# Patient Record
Sex: Male | Born: 1960 | Race: White | Hispanic: No | Marital: Married | Smoking: Former smoker
Health system: Southern US, Community
[De-identification: ages and names within clinical notes are randomized; demographics above are authoritative.]

## PROBLEM LIST (undated history)

## (undated) DIAGNOSIS — I10 Essential (primary) hypertension: Secondary | ICD-10-CM

## (undated) DIAGNOSIS — N289 Disorder of kidney and ureter, unspecified: Secondary | ICD-10-CM

## (undated) HISTORY — PX: KNEE SURGERY: SHX244

---

## 2011-06-26 ENCOUNTER — Emergency Department (HOSPITAL_COMMUNITY): Payer: BC Managed Care – PPO

## 2011-06-26 ENCOUNTER — Emergency Department (HOSPITAL_COMMUNITY)
Admission: EM | Admit: 2011-06-26 | Discharge: 2011-06-26 | Disposition: A | Discharge status: Home / Self Care | Payer: BC Managed Care – PPO | Attending: Emergency Medicine | Admitting: Emergency Medicine

## 2011-06-26 ENCOUNTER — Encounter: Payer: Self-pay | Admitting: Emergency Medicine

## 2011-06-26 DIAGNOSIS — R110 Nausea: Secondary | ICD-10-CM | POA: Insufficient documentation

## 2011-06-26 DIAGNOSIS — R109 Unspecified abdominal pain: Secondary | ICD-10-CM | POA: Insufficient documentation

## 2011-06-26 DIAGNOSIS — R61 Generalized hyperhidrosis: Secondary | ICD-10-CM | POA: Insufficient documentation

## 2011-06-26 DIAGNOSIS — K5732 Diverticulitis of large intestine without perforation or abscess without bleeding: Secondary | ICD-10-CM

## 2011-06-26 DIAGNOSIS — D72829 Elevated white blood cell count, unspecified: Secondary | ICD-10-CM

## 2011-06-26 DIAGNOSIS — I10 Essential (primary) hypertension: Secondary | ICD-10-CM | POA: Insufficient documentation

## 2011-06-26 DIAGNOSIS — E119 Type 2 diabetes mellitus without complications: Secondary | ICD-10-CM | POA: Insufficient documentation

## 2011-06-26 DIAGNOSIS — I959 Hypotension, unspecified: Secondary | ICD-10-CM | POA: Insufficient documentation

## 2011-06-26 DIAGNOSIS — R42 Dizziness and giddiness: Secondary | ICD-10-CM | POA: Insufficient documentation

## 2011-06-26 HISTORY — DX: Essential (primary) hypertension: I10

## 2011-06-26 LAB — BASIC METABOLIC PANEL
BUN: 18 mg/dL (ref 6–23)
Chloride: 98 mEq/L (ref 96–112)
GFR calc Af Amer: >60 mL/min (ref >60)
Potassium: 3.8 mEq/L (ref 3.5–5.1)

## 2011-06-26 LAB — DIFFERENTIAL
Lymphocytes Relative: 9 % — ABNORMAL LOW (ref 12–46)
Monocytes Absolute: 0.8 10*3/uL (ref 0.1–1.0)
Monocytes Relative: 6 % (ref 3–12)
Neutro Abs: 11.3 10*3/uL — ABNORMAL HIGH (ref 1.7–7.7)

## 2011-06-26 LAB — URINALYSIS, ROUTINE W REFLEX MICROSCOPIC
Bilirubin Urine: NEGATIVE
Glucose, UA: NEGATIVE mg/dL
Specific Gravity, Urine: 1.015 (ref 1.005–1.030)
pH: 7.5 (ref 5.0–8.0)

## 2011-06-26 LAB — CBC
HCT: 42 % (ref 39.0–52.0)
Hemoglobin: 14.1 g/dL (ref 13.0–17.0)
MCHC: 33.6 g/dL (ref 30.0–36.0)

## 2011-06-26 LAB — URINE MICROSCOPIC-ADD ON

## 2011-06-26 MED ORDER — METRONIDAZOLE 500 MG PO TABS: 500.0000 mg | ORAL_TABLET | Freq: Four times a day (QID) | ORAL | Status: AC

## 2011-06-26 MED ORDER — IOHEXOL 300 MG/ML  SOLN
100.0000 mL | Freq: Once | INTRAMUSCULAR | Status: AC | PRN
  Administered 2011-06-26: 100 mL via INTRAVENOUS

## 2011-06-26 MED ORDER — OXYCODONE-ACETAMINOPHEN 5-325 MG PO TABS: 2.0000 | ORAL_TABLET | ORAL | Status: AC | PRN

## 2011-06-26 MED ORDER — CIPROFLOXACIN HCL 500 MG PO TABS: 500.0000 mg | ORAL_TABLET | Freq: Two times a day (BID) | ORAL | Status: AC

## 2011-06-26 MED ORDER — SODIUM CHLORIDE 0.9 % IV SOLN
INTRAVENOUS | Status: DC
  Administered 2011-06-26: 15:00:00 via INTRAVENOUS

## 2011-06-26 NOTE — ED Notes (Signed)
Pt c/o rt lower abd pain he describes at pressure. Pt states he has been sweating excessively and bp was 91/64

## 2011-06-26 NOTE — ED Provider Notes (Addendum)
History     CSN: [id number] Arrival date & time: 06/26/2011 11:54 AM   Chief Complaint  Patient presents with  . Abdominal Pain  . Excessive Sweating  . Hypotension     (Include location/radiation/quality/duration/timing/severity/associated sxs/prior treatment) HPI The patient is a 50 year old male with a history of hypertension.  He presents to the emergency department with the complaint of right lower abdominal pain since yesterday.  Nausea, diaphoresis, anorexia.  His wife said that he became very pale.  He denies fever, cough, diarrhea, or hematuria.  He has not had these symptoms in the past.  Past Medical History  Diagnosis Date  . Hypertension   . Diabetes mellitus      History reviewed. No pertinent past surgical history.  History reviewed. No pertinent family history.  History  Substance Use Topics  . Smoking status: Former Games developer  . Smokeless tobacco: Not on file  . Alcohol Use: No      Review of Systems  Constitutional: Positive for diaphoresis. Negative for fever.  HENT: Negative for neck pain.   Eyes: Negative for redness.  Respiratory: Negative for cough, chest tightness and shortness of breath.   Cardiovascular: Negative for chest pain and palpitations.  Gastrointestinal: Positive for nausea and abdominal pain. Negative for vomiting and diarrhea.  Genitourinary: Negative for hematuria.  Musculoskeletal: Negative for back pain.  Skin: Positive for pallor. Negative for rash.       His wife said he was pale when he was diaphoretic at home however, his color is normal.  Now  Neurological: Positive for light-headedness. Negative for headaches.       He was lightheaded at home, however, he denies lightheadedness, now  Psychiatric/Behavioral: Negative for confusion.    Allergies  Review of patient's allergies indicates no known allergies.  Home Medications   Current Outpatient Rx  Name Route Sig Dispense Refill  . ASPIRIN 81 MG PO TABS Oral Take 81  mg by mouth daily.      . ATORVASTATIN CALCIUM 40 MG PO TABS Oral Take 40 mg by mouth daily.      . IBUPROFEN 200 MG PO TABS Oral Take 600 mg by mouth every 6 (six) hours as needed. Pain     . METFORMIN HCL 500 MG PO TABS Oral Take 500 mg by mouth daily.      Marland Kitchen ONE-DAILY MULTI VITAMINS PO TABS Oral Take 1 tablet by mouth daily.      Marland Kitchen OLMESARTAN MEDOXOMIL 20 MG PO TABS Oral Take 20 mg by mouth daily.      Marland Kitchen FISH OIL 1200 MG PO CAPS Oral Take 1 capsule by mouth daily.        Physical Exam    BP 110/82  Pulse 93  Temp(Src) 98.6 F (37 C) (Oral)  Resp 16  Ht 5\' 9"  (1.753 m)  Wt 250 lb (113.399 kg)  BMI 36.92 kg/m2  SpO2 97%  Physical Exam  Constitutional: He is oriented to person, place, and time. He appears well-developed and well-nourished. No distress.       Obese  HENT:  Head: Normocephalic and atraumatic.  Eyes: EOM are normal. Pupils are equal, round, and reactive to light.  Neck: Normal range of motion. Neck supple.  Cardiovascular: Normal rate, regular rhythm and normal heart sounds.   No murmur heard. Pulmonary/Chest: Effort normal and breath sounds normal. No respiratory distress. He has no wheezes. He has no rales.  Abdominal: Soft. Bowel sounds are normal. He exhibits no distension and no mass.  There is tenderness. There is rebound. There is no guarding.       Right lower quadrant tenderness is mild  Musculoskeletal: Normal range of motion. He exhibits no edema and no tenderness.  Neurological: He is alert and oriented to person, place, and time. No cranial nerve deficit.  Skin: Skin is warm and dry. He is not diaphoretic.  Psychiatric: He has a normal mood and affect. His behavior is normal.    ED Course  Procedures  Right lower abdominal pain with tenderness to palpation, nausea, anorexia, and a history of diaphoresis and lightheadedness.  These symptoms and findings in the setting of a leukocytosis in a male suggests a appendicitis.  We will perform a CAT scan to  evaluate for this or other etiology for his symptoms.  At this point.  He does not want any pain medicines.  5:58 PM Still has only mild pain with no peritoneal signs.  I explained the results of his tests and the treatment course.  He and his wife understand and agree with the plan.  Results for orders placed during the hospital encounter of 06/26/11  BASIC METABOLIC PANEL      Component Value Range   Sodium 133 (*) 135 - 145 (mEq/L)   Potassium 3.8  3.5 - 5.1 (mEq/L)   Chloride 98  96 - 112 (mEq/L)   CO2 24  19 - 32 (mEq/L)   Glucose, Bld 173 (*) 70 - 99 (mg/dL)   BUN 18  6 - 23 (mg/dL)   Creatinine, Ser 1.61  0.50 - 1.35 (mg/dL)   Calcium 9.1  8.4 - 09.6 (mg/dL)   GFR calc non Af Amer >60  >60 (mL/min)   GFR calc Af Amer >60  >60 (mL/min)  CBC      Component Value Range   WBC 13.4 (*) 4.0 - 10.5 (K/uL)   RBC 4.92  4.22 - 5.81 (MIL/uL)   Hemoglobin 14.1  13.0 - 17.0 (g/dL)   HCT 04.5  40.9 - 81.1 (%)   MCV 85.4  78.0 - 100.0 (fL)   MCH 28.7  26.0 - 34.0 (pg)   MCHC 33.6  30.0 - 36.0 (g/dL)   RDW 91.4  78.2 - 95.6 (%)   Platelets 218  150 - 400 (K/uL)  DIFFERENTIAL      Component Value Range   Neutrophils Relative 85 (*) 43 - 77 (%)   Neutro Abs 11.3 (*) 1.7 - 7.7 (K/uL)   Lymphocytes Relative 9 (*) 12 - 46 (%)   Lymphs Abs 1.2  0.7 - 4.0 (K/uL)   Monocytes Relative 6  3 - 12 (%)   Monocytes Absolute 0.8  0.1 - 1.0 (K/uL)   Eosinophils Relative 0  0 - 5 (%)   Eosinophils Absolute 0.0  0.0 - 0.7 (K/uL)   Basophils Relative 0  0 - 1 (%)   Basophils Absolute 0.0  0.0 - 0.1 (K/uL)  URINALYSIS, ROUTINE W REFLEX MICROSCOPIC      Component Value Range   Color, Urine YELLOW  YELLOW    Appearance CLEAR  CLEAR    Specific Gravity, Urine 1.015  1.005 - 1.030    pH 7.5  5.0 - 8.0    Glucose, UA NEGATIVE  NEGATIVE (mg/dL)   Hgb urine dipstick TRACE (*) NEGATIVE    Bilirubin Urine NEGATIVE  NEGATIVE    Ketones, ur NEGATIVE  NEGATIVE (mg/dL)   Protein, ur NEGATIVE  NEGATIVE  (mg/dL)   Urobilinogen, UA 0.2  0.0 -  1.0 (mg/dL)   Nitrite NEGATIVE  NEGATIVE    Leukocytes, UA NEGATIVE  NEGATIVE   URINE MICROSCOPIC-ADD ON      Component Value Range   Squamous Epithelial / LPF RARE  RARE    WBC, UA 0-2  <3 (WBC/hpf)   RBC / HPF 0-2  <3 (RBC/hpf)   Bacteria, UA RARE  RARE     Date: 06/26/2011  Rate: 84  Rhythm: normal sinus rhythm  QRS Axis: normal  Intervals: normal  ST/T Wave abnormalities: nonspecific T wave changes  Conduction Disutrbances:none  Narrative Interpretation: NSR with nonspecific t wave changes  Old EKG Reviewed: not reviewed      MDM Abdominal pain Diverticulitis of the sigmoid colon.  No abscesses or perforations.  No appendicitis.       Nicholes Stairs, MD 06/26/11 1759  Nicholes Stairs, MD 08/23/11 1438

## 2011-06-26 NOTE — ED Notes (Signed)
Patient with no complaints at this time. Respirations even and unlabored. Skin warm/dry. Discharge instructions reviewed with patient at this time. Patient given opportunity to voice concerns/ask questions. IV removed per policy and band-aid applied to site. Patient discharged at this time and left Emergency Department with steady gait.  

## 2011-06-26 NOTE — ED Notes (Signed)
Family to desk wanting to know results of scan. Dr Nino Parsley notified.

## 2013-08-31 ENCOUNTER — Other Ambulatory Visit: Payer: Self-pay | Admitting: Endocrinology

## 2013-08-31 ENCOUNTER — Other Ambulatory Visit: Payer: Self-pay | Admitting: *Deleted

## 2013-08-31 DIAGNOSIS — E119 Type 2 diabetes mellitus without complications: Secondary | ICD-10-CM

## 2013-08-31 DIAGNOSIS — E230 Hypopituitarism: Secondary | ICD-10-CM

## 2013-08-31 DIAGNOSIS — R7303 Prediabetes: Secondary | ICD-10-CM | POA: Insufficient documentation

## 2015-12-28 ENCOUNTER — Encounter (HOSPITAL_COMMUNITY): Payer: Self-pay | Admitting: *Deleted

## 2015-12-28 ENCOUNTER — Emergency Department (HOSPITAL_COMMUNITY): Payer: BLUE CROSS/BLUE SHIELD

## 2015-12-28 ENCOUNTER — Emergency Department (HOSPITAL_COMMUNITY)
Admission: EM | Admit: 2015-12-28 | Discharge: 2015-12-28 | Disposition: A | Discharge status: Home / Self Care | Payer: BLUE CROSS/BLUE SHIELD | Attending: Emergency Medicine | Admitting: Emergency Medicine

## 2015-12-28 DIAGNOSIS — I10 Essential (primary) hypertension: Secondary | ICD-10-CM | POA: Insufficient documentation

## 2015-12-28 DIAGNOSIS — Z7984 Long term (current) use of oral hypoglycemic drugs: Secondary | ICD-10-CM | POA: Insufficient documentation

## 2015-12-28 DIAGNOSIS — N201 Calculus of ureter: Secondary | ICD-10-CM | POA: Diagnosis not present

## 2015-12-28 DIAGNOSIS — Z7982 Long term (current) use of aspirin: Secondary | ICD-10-CM | POA: Insufficient documentation

## 2015-12-28 DIAGNOSIS — E119 Type 2 diabetes mellitus without complications: Secondary | ICD-10-CM | POA: Diagnosis not present

## 2015-12-28 DIAGNOSIS — Z87891 Personal history of nicotine dependence: Secondary | ICD-10-CM | POA: Diagnosis not present

## 2015-12-28 DIAGNOSIS — R109 Unspecified abdominal pain: Secondary | ICD-10-CM | POA: Diagnosis present

## 2015-12-28 LAB — URINE MICROSCOPIC-ADD ON
SQUAMOUS EPITHELIAL / LPF: NONE SEEN
WBC, UA: NONE SEEN WBC/hpf (ref 0–5)

## 2015-12-28 LAB — CBC WITH DIFFERENTIAL/PLATELET
BASOS PCT: 0 %
Basophils Absolute: 0 10*3/uL (ref 0.0–0.1)
Eosinophils Absolute: 0 10*3/uL (ref 0.0–0.7)
Eosinophils Relative: 0 %
HCT: 42.8 % (ref 39.0–52.0)
HEMOGLOBIN: 14.5 g/dL (ref 13.0–17.0)
LYMPHS PCT: 13 %
Lymphs Abs: 1.4 10*3/uL (ref 0.7–4.0)
MCH: 29.2 pg (ref 26.0–34.0)
MCHC: 33.9 g/dL (ref 30.0–36.0)
MCV: 86.1 fL (ref 78.0–100.0)
Monocytes Absolute: 0.4 10*3/uL (ref 0.1–1.0)
Monocytes Relative: 4 %
NEUTROS ABS: 8.5 10*3/uL — AB (ref 1.7–7.7)
NEUTROS PCT: 83 %
PLATELETS: 204 10*3/uL (ref 150–400)
RBC: 4.97 MIL/uL (ref 4.22–5.81)
RDW: 13 % (ref 11.5–15.5)
WBC: 10.4 10*3/uL (ref 4.0–10.5)

## 2015-12-28 LAB — COMPREHENSIVE METABOLIC PANEL
ALT: 43 U/L (ref 17–63)
ANION GAP: 7 (ref 5–15)
AST: 28 U/L (ref 15–41)
Albumin: 4.1 g/dL (ref 3.5–5.0)
Alkaline Phosphatase: 71 U/L (ref 38–126)
BUN: 25 mg/dL — ABNORMAL HIGH (ref 6–20)
CHLORIDE: 105 mmol/L (ref 101–111)
CO2: 26 mmol/L (ref 22–32)
Calcium: 8.8 mg/dL — ABNORMAL LOW (ref 8.9–10.3)
Creatinine, Ser: 1.15 mg/dL (ref 0.61–1.24)
Glucose, Bld: 234 mg/dL — ABNORMAL HIGH (ref 65–99)
POTASSIUM: 4.2 mmol/L (ref 3.5–5.1)
Sodium: 138 mmol/L (ref 135–145)
Total Bilirubin: 0.8 mg/dL (ref 0.3–1.2)
Total Protein: 7.4 g/dL (ref 6.5–8.1)

## 2015-12-28 LAB — URINALYSIS, ROUTINE W REFLEX MICROSCOPIC
Glucose, UA: 250 mg/dL — AB
KETONES UR: NEGATIVE mg/dL
Leukocytes, UA: NEGATIVE
NITRITE: NEGATIVE
PROTEIN: NEGATIVE mg/dL
SPECIFIC GRAVITY, URINE: 1.03 (ref 1.005–1.030)
pH: 5 (ref 5.0–8.0)

## 2015-12-28 LAB — LIPASE, BLOOD: LIPASE: 28 U/L (ref 11–51)

## 2015-12-28 MED ORDER — OXYCODONE-ACETAMINOPHEN 5-325 MG PO TABS: 1.0000 | ORAL_TABLET | ORAL | Status: AC | PRN

## 2015-12-28 MED ORDER — HYDROMORPHONE HCL 1 MG/ML IJ SOLN
1.0000 mg | Freq: Once | INTRAMUSCULAR | Status: AC
  Administered 2015-12-28: 1 mg via INTRAVENOUS
  Filled 2015-12-28: qty 1

## 2015-12-28 MED ORDER — FENTANYL CITRATE (PF) 100 MCG/2ML IJ SOLN
50.0000 ug | Freq: Once | INTRAMUSCULAR | Status: AC
  Administered 2015-12-28: 50 ug via INTRAVENOUS
  Filled 2015-12-28: qty 2

## 2015-12-28 MED ORDER — KETOROLAC TROMETHAMINE 30 MG/ML IJ SOLN
30.0000 mg | Freq: Once | INTRAMUSCULAR | Status: AC
  Administered 2015-12-28: 30 mg via INTRAVENOUS
  Filled 2015-12-28: qty 1

## 2015-12-28 MED ORDER — TAMSULOSIN HCL 0.4 MG PO CAPS: ORAL_CAPSULE | ORAL | Status: DC

## 2015-12-28 MED ORDER — SODIUM CHLORIDE 0.9 % IV SOLN
INTRAVENOUS | Status: DC
  Administered 2015-12-28: 06:00:00 via INTRAVENOUS

## 2015-12-28 MED ORDER — KETOROLAC TROMETHAMINE 10 MG PO TABS: 10.0000 mg | ORAL_TABLET | Freq: Four times a day (QID) | ORAL | Status: AC

## 2015-12-28 MED ORDER — ONDANSETRON HCL 4 MG/2ML IJ SOLN
4.0000 mg | Freq: Once | INTRAMUSCULAR | Status: AC
  Administered 2015-12-28: 4 mg via INTRAVENOUS
  Filled 2015-12-28: qty 2

## 2015-12-28 MED ORDER — ONDANSETRON 4 MG PO TBDP: ORAL_TABLET | ORAL | Status: AC

## 2015-12-28 MED ORDER — SODIUM CHLORIDE 0.9 % IV BOLUS (SEPSIS): 1000.0000 mL | Freq: Once | INTRAVENOUS | Status: DC

## 2015-12-28 NOTE — ED Notes (Signed)
MD at bedside. 

## 2015-12-28 NOTE — ED Notes (Signed)
Pt c/o left flank pain that woke him up from sleep; pt denies any urinary problems

## 2015-12-28 NOTE — ED Notes (Signed)
Pt states he has had this type of pain previously with an episode of diverticulitis

## 2015-12-28 NOTE — ED Provider Notes (Signed)
CSN: [id number]     Arrival date & time 12/28/15  0539 History   First MD Initiated Contact with Patient 12/28/15 0554     Chief Complaint  Patient presents with  . Flank Pain     (Consider location/radiation/quality/duration/timing/severity/associated sxs/prior Treatment) HPI patient reports he was awakened at 2 AM this morning with left flank pain that does not radiate into his abdomen. He describes the pain as being constant and a pressure sensation. He states sitting up makes it hurts more, walking seems to help it a little. He has had nausea with vomiting twice. He denies diarrhea or hematuria. He states he had similar pain when he had diverticulitis but he indicates that pain was in his right anterior abdomen. He also reports he was started on a new medication trulicity which she gets injected weekly for the past 5 or 6 weeks. He reports he has been having nausea since he started that medication. He states that the warnings do show increased risk of pancreatitis.   PCP Dr Jonelle Sports in [place], [place]  Past Medical History  Diagnosis Date  . Hypertension   . Diabetes mellitus    History reviewed. No pertinent past surgical history. History reviewed. No pertinent family history. Social History  Substance Use Topics  . Smoking status: Former Games developer  . Smokeless tobacco: None  . Alcohol Use: No  employed  Review of Systems  All other systems reviewed and are negative.     Allergies  Review of patient's allergies indicates no known allergies.  Home Medications   Prior to Admission medications   Medication Sig Start Date End Date Taking? Authorizing Provider  aspirin 81 MG tablet Take 81 mg by mouth daily.     Yes Historical Provider, MD  atorvastatin (LIPITOR) 40 MG tablet Take 40 mg by mouth daily.     Yes Historical Provider, MD  Dulaglutide (TRULICITY) 0.75 MG/0.5ML SOPN Inject 1 Dose into the skin once a week.   Yes Historical Provider, MD  ibuprofen (ADVIL,MOTRIN) 200  MG tablet Take 600 mg by mouth every 6 (six) hours as needed. Pain    Yes Historical Provider, MD  metFORMIN (GLUCOPHAGE) 500 MG tablet Take 500 mg by mouth daily.     Yes Historical Provider, MD  Multiple Vitamin (MULTIVITAMIN) tablet Take 1 tablet by mouth daily.     Yes Historical Provider, MD  olmesartan (BENICAR) 20 MG tablet Take 40 mg by mouth daily.    Yes Historical Provider, MD  Omega-3 Fatty Acids (FISH OIL) 1200 MG CAPS Take 1 capsule by mouth daily.     Yes Historical Provider, MD  ketorolac (TORADOL) 10 MG tablet Take 1 tablet (10 mg total) by mouth every 6 (six) hours. Take with food 12/28/15   Devoria Albe, MD  ondansetron (ZOFRAN ODT) 4 MG disintegrating tablet Take 1 or 2 po Q 8hrs for nausea or vomiting 12/28/15   Devoria Albe, MD  oxyCODONE-acetaminophen (PERCOCET/ROXICET) 5-325 MG tablet Take 1-2 tablets by mouth every 4 (four) hours as needed for severe pain. 12/28/15   Devoria Albe, MD  tamsulosin (FLOMAX) 0.4 MG CAPS capsule Take 1 po QD until you pass the stone. 12/28/15   Devoria Albe, MD   BP 153/98 mmHg  Pulse 81  Temp(Src) 97.8 F (36.6 C) (Oral)  Resp 16  SpO2 96%  Vital signs normal   Physical Exam  Constitutional: He is oriented to person, place, and time. He appears well-developed and well-nourished.  Non-toxic appearance. He does not appear  ill. No distress.  HENT:  Head: Normocephalic and atraumatic.  Right Ear: External ear normal.  Left Ear: External ear normal.  Nose: Nose normal. No mucosal edema or rhinorrhea.  Mouth/Throat: Oropharynx is clear and moist and mucous membranes are normal. No dental abscesses or uvula swelling.  Eyes: Conjunctivae and EOM are normal. Pupils are equal, round, and reactive to light.  Neck: Normal range of motion and full passive range of motion without pain. Neck supple.  Cardiovascular: Normal rate, regular rhythm and normal heart sounds.  Exam reveals no gallop and no friction rub.   No murmur heard. Pulmonary/Chest: Effort normal  and breath sounds normal. No respiratory distress. He has no wheezes. He has no rhonchi. He has no rales. He exhibits no tenderness and no crepitus.  Abdominal: Soft. Normal appearance and bowel sounds are normal. He exhibits no distension. There is no tenderness. There is no rebound and no guarding.  Musculoskeletal: Normal range of motion. He exhibits no edema or tenderness.       Back:  Area of pain noted. Moves all extremities well.   Neurological: He is alert and oriented to person, place, and time. He has normal strength. No cranial nerve deficit.  Skin: Skin is warm, dry and intact. No rash noted. No erythema. No pallor.  Psychiatric: He has a normal mood and affect. His speech is normal and behavior is normal. His mood appears not anxious.  Nursing note and vitals reviewed.   ED Course  Procedures (including critical care time)  Medications  0.9 %  sodium chloride infusion ( Intravenous New Bag/Given 12/28/15 0623)  ondansetron (ZOFRAN) injection 4 mg (4 mg Intravenous Given 12/28/15 0623)  fentaNYL (SUBLIMAZE) injection 50 mcg (50 mcg Intravenous Given 12/28/15 0623)  ketorolac (TORADOL) 30 MG/ML injection 30 mg (30 mg Intravenous Given 12/28/15 0728)  HYDROmorphone (DILAUDID) injection 1 mg (1 mg Intravenous Given 12/28/15 0729)   Patient was given IV fluids, fentanyl and Zofran for his pain and nausea. CT scan was ordered for possible renal stone  Patient was rechecked at 7 AM. He states his pain is slightly better. We are still waiting for his CT to be read by the radiologist. He was reassured though that his blood tests for pancreatitis were normal.  7:20 AM patient was given the results of his CT scan. He was given IV Toradol and Dilaudid for his pain. We discussed that he should pass the stone in the next day or possibly week. He will be sent home with pain medication. He will be referred to [facility]. He should return to the ED if he gets fever, uncontrolled vomiting, or  uncontrolled pain. When he is at work he should drink in addition to just plain water some sports drinks to prevent dehydration. Patient reports he has already cut out caffeine and milk products.  07:50 pt states his pain is gone, informed we need a UA so he can be discharged. Dr Deretha Emory will check his UA results and add antibiotics if needed.   Labs Review Results for orders placed or performed during the hospital encounter of 12/28/15  Comprehensive metabolic panel  Result Value Ref Range   Sodium 138 135 - 145 mmol/L   Potassium 4.2 3.5 - 5.1 mmol/L   Chloride 105 101 - 111 mmol/L   CO2 26 22 - 32 mmol/L   Glucose, Bld 234 (H) 65 - 99 mg/dL   BUN 25 (H) 6 - 20 mg/dL   Creatinine, Ser 1.61 0.61 -  1.24 mg/dL   Calcium 8.8 (L) 8.9 - 10.3 mg/dL   Total Protein 7.4 6.5 - 8.1 g/dL   Albumin 4.1 3.5 - 5.0 g/dL   AST 28 15 - 41 U/L   ALT 43 17 - 63 U/L   Alkaline Phosphatase 71 38 - 126 U/L   Total Bilirubin 0.8 0.3 - 1.2 mg/dL   GFR calc non Af Amer >60 >60 mL/min   GFR calc Af Amer >60 >60 mL/min   Anion gap 7 5 - 15  Lipase, blood  Result Value Ref Range   Lipase 28 11 - 51 U/L  CBC with Differential  Result Value Ref Range   WBC 10.4 4.0 - 10.5 K/uL   RBC 4.97 4.22 - 5.81 MIL/uL   Hemoglobin 14.5 13.0 - 17.0 g/dL   HCT 16.1 09.6 - 04.5 %   MCV 86.1 78.0 - 100.0 fL   MCH 29.2 26.0 - 34.0 pg   MCHC 33.9 30.0 - 36.0 g/dL   RDW 40.9 81.1 - 91.4 %   Platelets 204 150 - 400 K/uL   Neutrophils Relative % 83 %   Neutro Abs 8.5 (H) 1.7 - 7.7 K/uL   Lymphocytes Relative 13 %   Lymphs Abs 1.4 0.7 - 4.0 K/uL   Monocytes Relative 4 %   Monocytes Absolute 0.4 0.1 - 1.0 K/uL   Eosinophils Relative 0 %   Eosinophils Absolute 0.0 0.0 - 0.7 K/uL   Basophils Relative 0 %   Basophils Absolute 0.0 0.0 - 0.1 K/uL   Laboratory interpretation all normal except UA not resulted.      Imaging Review Ct Renal Stone Study  12/28/2015  CLINICAL DATA:  55 year old male with left-sided  abdominal pain and flank pain. EXAM: CT ABDOMEN AND PELVIS WITHOUT CONTRAST TECHNIQUE: Multidetector CT imaging of the abdomen and pelvis was performed following the standard protocol without IV contrast. COMPARISON:  CT dated 06/26/2011 FINDINGS: Evaluation of this exam is limited in the absence of intravenous contrast. The visualized lung bases are clear. No intra-abdominal free air or free fluid. Mild diffuse fatty infiltration of the liver. The gallbladder, pancreas, spleen, adrenal glands appear unremarkable. There is a 3 mm stone at the left ureterovesical junction with mild left hydronephrosis. Multiple small nonobstructing left renal calculi noted. Punctate nonobstructing right renal calculi may be present. There is no hydronephrosis on the right. The urinary bladder appears unremarkable. The prostate and seminal vesicles are grossly unremarkable. There is sigmoid diverticulosis without active inflammatory changes. Moderate stool noted throughout the colon. No evidence of bowel obstruction or inflammation. Normal appendix. There is mild aortoiliac atherosclerotic disease. No portal venous gas identified. There is no adenopathy. Small fat containing right inguinal hernia. The abdominal wall soft tissues appear unremarkable. There is mild degenerative changes of the spine. No acute fracture. IMPRESSION: A 3 mm left UVJ stone with mild left hydronephrosis. Sigmoid diverticulosis. No evidence of bowel obstruction or inflammation. Normal appendix. Electronically Signed   By: Elgie Collard M.D.   On: 12/28/2015 07:11   I have personally reviewed and evaluated these images and lab results as part of my medical decision-making.    MDM   Final diagnoses:  Left ureteral stone   New Prescriptions   KETOROLAC (TORADOL) 10 MG TABLET    Take 1 tablet (10 mg total) by mouth every 6 (six) hours. Take with food   ONDANSETRON (ZOFRAN ODT) 4 MG DISINTEGRATING TABLET    Take 1 or 2 po Q 8hrs for nausea  or  vomiting   OXYCODONE-ACETAMINOPHEN (PERCOCET/ROXICET) 5-325 MG TABLET    Take 1-2 tablets by mouth every 4 (four) hours as needed for severe pain.   TAMSULOSIN (FLOMAX) 0.4 MG CAPS CAPSULE    Take 1 po QD until you pass the stone.    Plan discharge  Devoria AlbeIva Kemo Spruce, MD, [facility]      Valita Righter, MD 12/28/15 [phone]

## 2015-12-28 NOTE — Discharge Instructions (Signed)
Drink plenty of fluids. Take the medications as prescribed. You have a "3 mm distal UVJ stone on the left".  Return to the ED if you have fever, uncontrolled vomiting pain. Otherwise you can follow-up with [facility] if you have not passed the stone in the next week. Drink plenty of fluids when sweating.    Kidney Stones Kidney stones (urolithiasis) are deposits that form inside your kidneys. The intense pain is caused by the stone moving through the urinary tract. When the stone moves, the ureter goes into spasm around the stone. The stone is usually passed in the urine.  CAUSES   A disorder that makes certain neck glands produce too much parathyroid hormone (primary hyperparathyroidism).  A buildup of uric acid crystals, similar to gout in your joints.  Narrowing (stricture) of the ureter.  A kidney obstruction present at birth (congenital obstruction).  Previous surgery on the kidney or ureters.  Numerous kidney infections. SYMPTOMS   Feeling sick to your stomach (nauseous).  Throwing up (vomiting).  Blood in the urine (hematuria).  Pain that usually spreads (radiates) to the groin.  Frequency or urgency of urination. DIAGNOSIS   Taking a history and physical exam.  Blood or urine tests.  CT scan.  Occasionally, an examination of the inside of the urinary bladder (cystoscopy) is performed. TREATMENT   Observation.  Increasing your fluid intake.  Extracorporeal shock wave lithotripsy--This is a noninvasive procedure that uses shock waves to break up kidney stones.  Surgery may be needed if you have severe pain or persistent obstruction. There are various surgical procedures. Most of the procedures are performed with the use of small instruments. Only small incisions are needed to accommodate these instruments, so recovery time is minimized. The size, location, and chemical composition are all important variables that will determine the proper choice of action  for you. Talk to your health care provider to better understand your situation so that you will minimize the risk of injury to yourself and your kidney.  HOME CARE INSTRUCTIONS   Drink enough water and fluids to keep your urine clear or pale yellow. This will help you to pass the stone or stone fragments.  Strain all urine through the provided strainer. Keep all particulate matter and stones for your health care provider to see. The stone causing the pain may be as small as a grain of salt. It is very important to use the strainer each and every time you pass your urine. The collection of your stone will allow your health care provider to analyze it and verify that a stone has actually passed. The stone analysis will often identify what you can do to reduce the incidence of recurrences.  Only take over-the-counter or prescription medicines for pain, discomfort, or fever as directed by your health care provider.  Keep all follow-up visits as told by your health care provider. This is important.  Get follow-up X-rays if required. The absence of pain does not always mean that the stone has passed. It may have only stopped moving. If the urine remains completely obstructed, it can cause loss of kidney function or even complete destruction of the kidney. It is your responsibility to make sure X-rays and follow-ups are completed. Ultrasounds of the kidney can show blockages and the status of the kidney. Ultrasounds are not associated with any radiation and can be performed easily in a matter of minutes.  Make changes to your daily diet as told by your health care provider. You may  be told to:  Limit the amount of salt that you eat.  Eat 5 or more servings of fruits and vegetables each day.  Limit the amount of meat, poultry, fish, and eggs that you eat.  Collect a 24-hour urine sample as told by your health care provider.You may need to collect another urine sample every 6-12 months. SEEK MEDICAL  CARE IF:  You experience pain that is progressive and unresponsive to any pain medicine you have been prescribed. SEEK IMMEDIATE MEDICAL CARE IF:   Pain cannot be controlled with the prescribed medicine.  You have a fever or shaking chills.  The severity or intensity of pain increases over 18 hours and is not relieved by pain medicine.  You develop a new onset of abdominal pain.  You feel faint or pass out.  You are unable to urinate.   This information is not intended to replace advice given to you by your health care provider. Make sure you discuss any questions you have with your health care provider.   Document Released: 09/24/2005 Document Revised: 06/15/2015 Document Reviewed: 02/25/2013 [employer] Interactive Patient Education [employer]2016 [employer]

## 2018-01-09 IMAGING — CT CT RENAL STONE PROTOCOL
2 of 4 series · 17 of 46 positions shown, 19 images · non-contrast
Comparison: CT dated 06/26/2011

CLINICAL DATA: 54-year-old male with left-sided abdominal pain and
flank pain.

EXAM:
CT ABDOMEN AND PELVIS WITHOUT CONTRAST
TECHNIQUE: Multidetector CT imaging of the abdomen and pelvis was performed
following the standard protocol without IV contrast.

[Series 2: standard/full over (age)lbs 5.0 · axial · 0.98mm/px · z∈[-528,-48]mm · 14 of 106 slices shown, 16 images]
[im 5/106  soft-tissue]
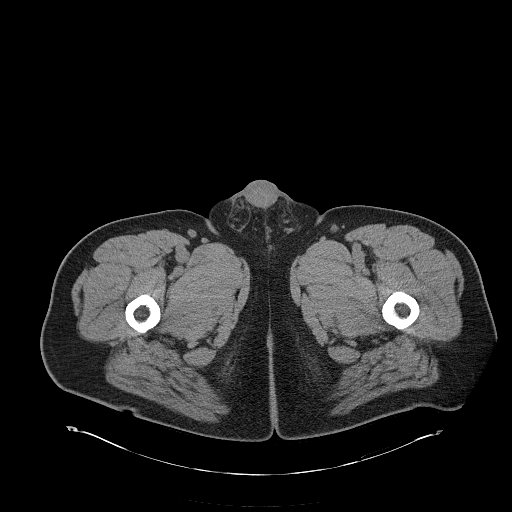
[im 5/106  bone]
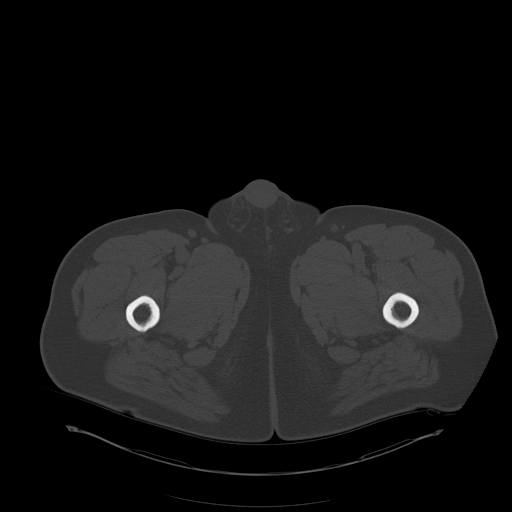
[im 14/106  soft-tissue]
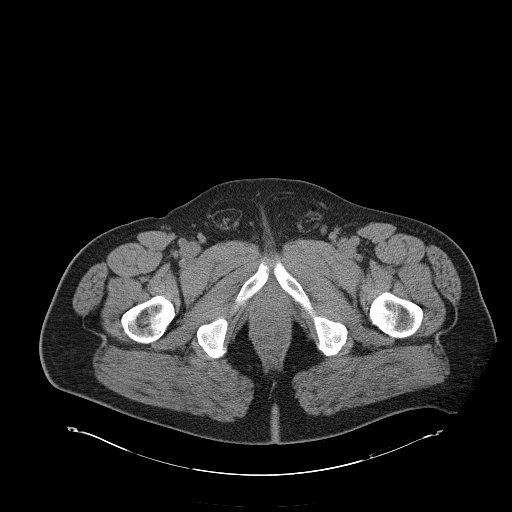
[im 22/106  soft-tissue]
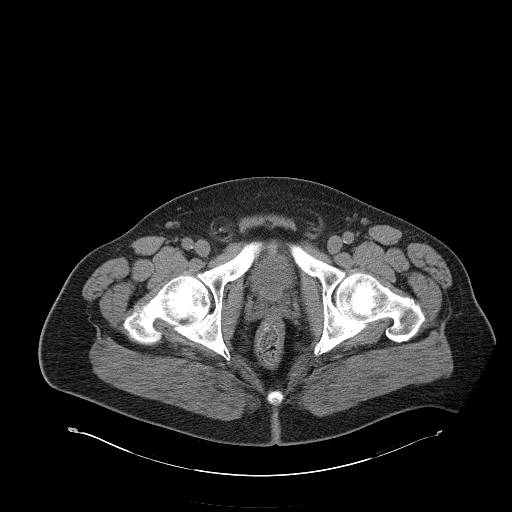
[im 27/106  soft-tissue]
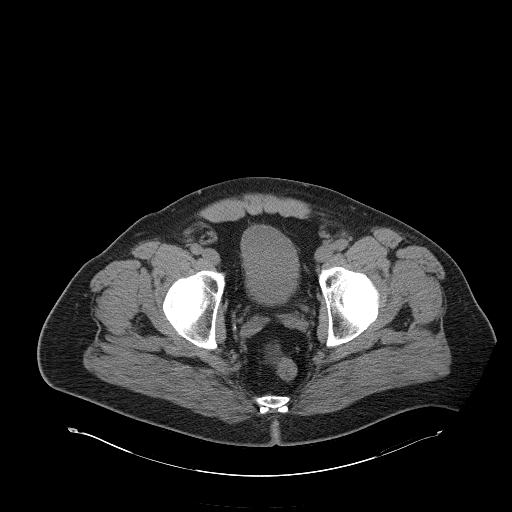
[im 36/106  soft-tissue]
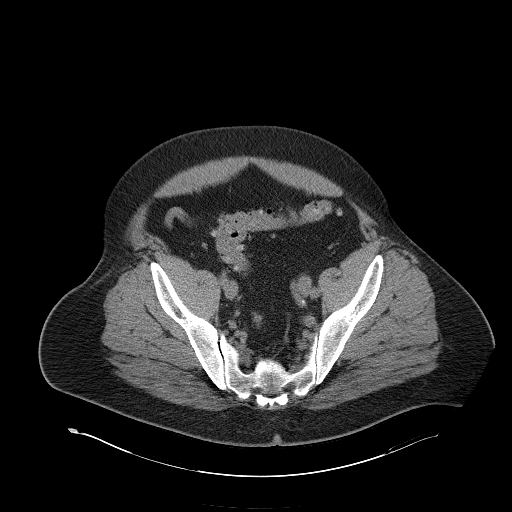
[im 44/106  soft-tissue]
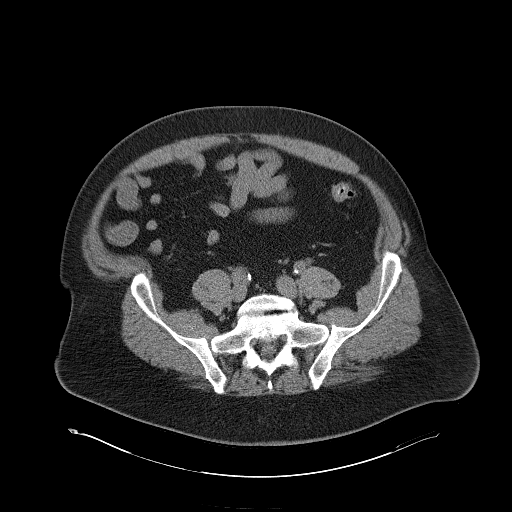
[im 49/106  soft-tissue]
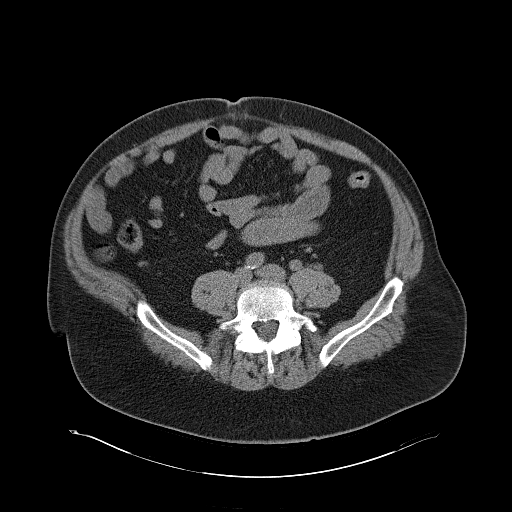
[im 57/106  soft-tissue]
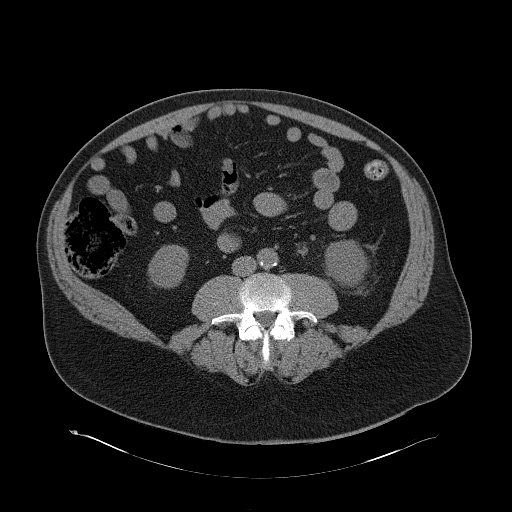
[im 62/106  soft-tissue]
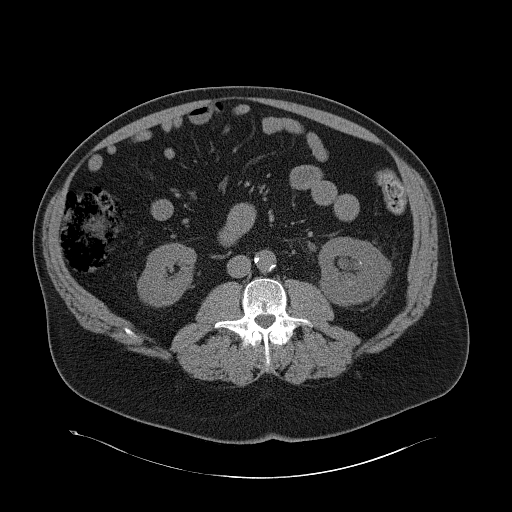
[im 62/106  bone]
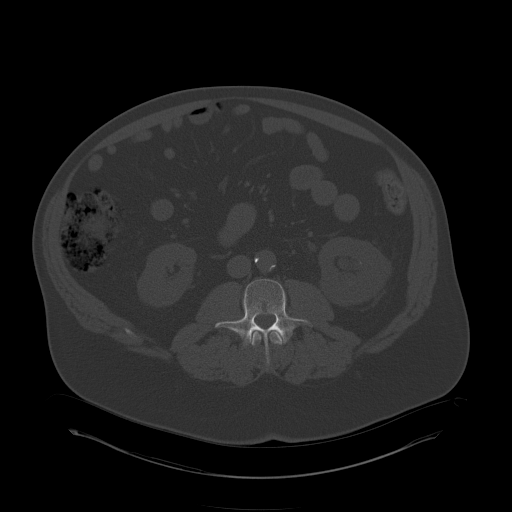
[im 71/106  soft-tissue]
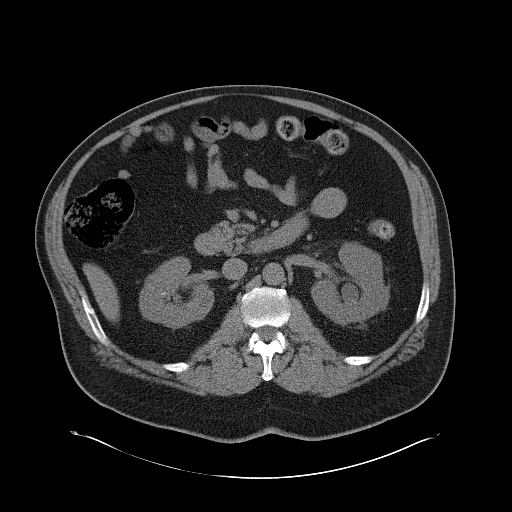
[im 79/106  soft-tissue]
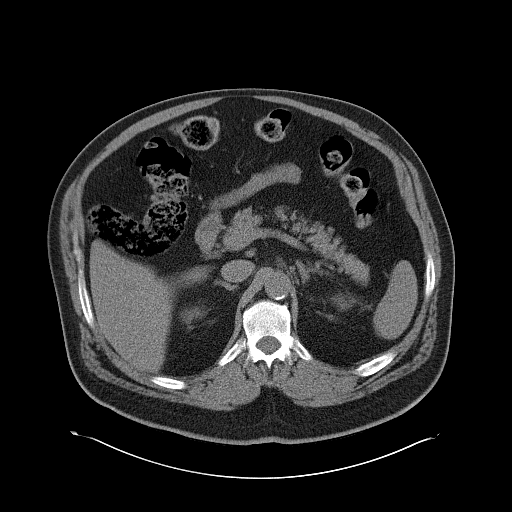
[im 84/106  soft-tissue]
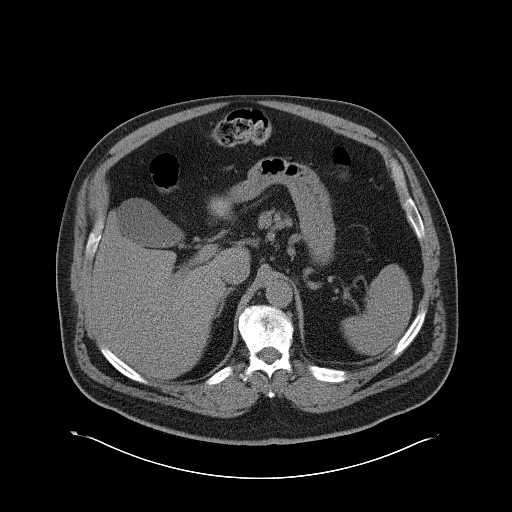
[im 92/106  soft-tissue]
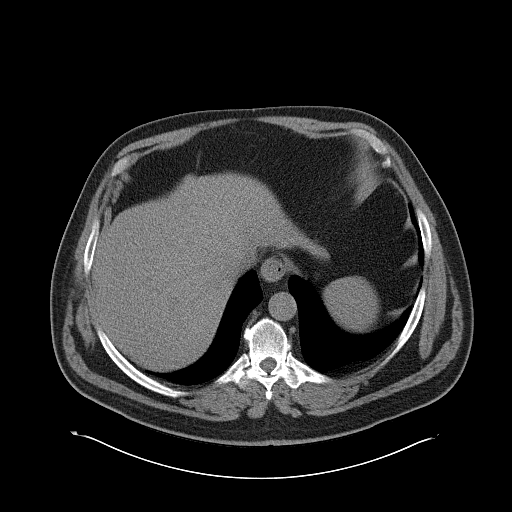
[im 101/106  soft-tissue]
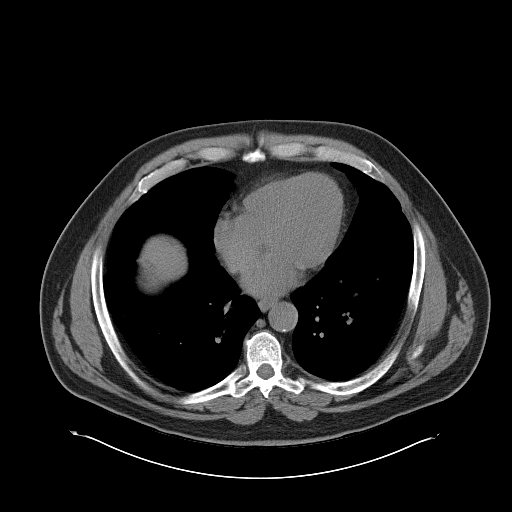

[Series 3: mpr coronal · coronal · 1.03mm/px · 3 of 114 slices shown]
[im 38/114  soft-tissue]
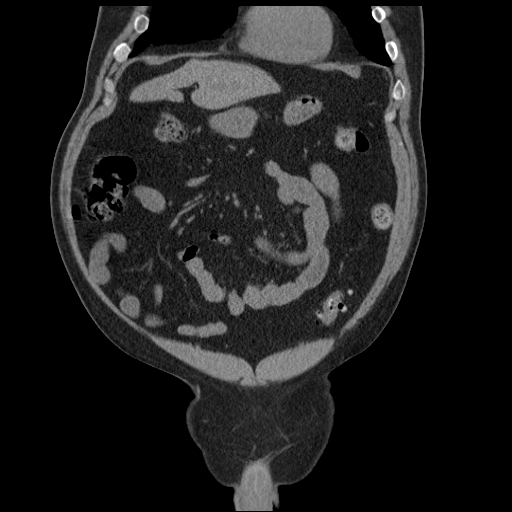
[im 51/114  soft-tissue]
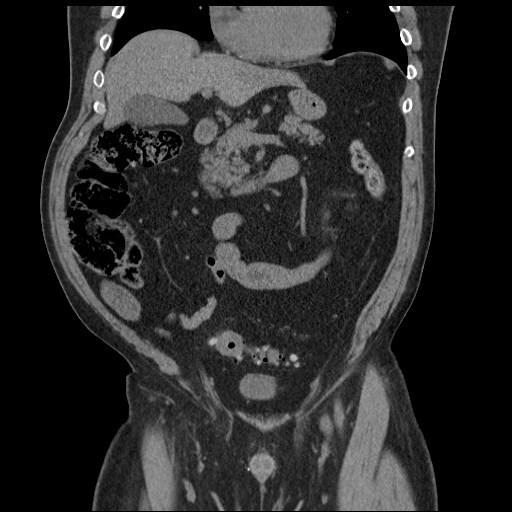
[im 63/114  soft-tissue]
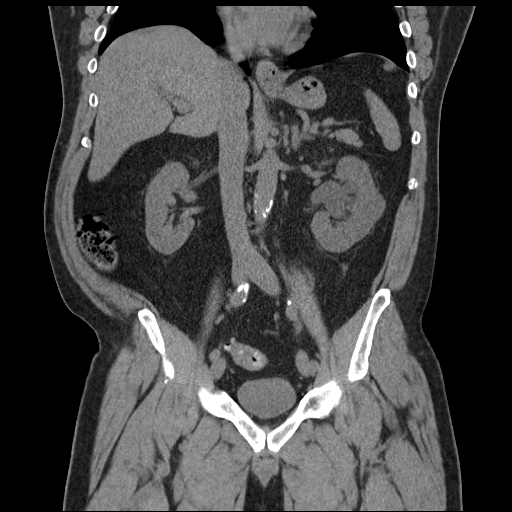

[17 of 46 positions shown; findings below may reference images not displayed]

FINDINGS: Evaluation of this exam is limited in the absence of intravenous
contrast.

The visualized lung bases are clear. No intra-abdominal free air or
free fluid.

Mild diffuse fatty infiltration of the liver. The gallbladder,
pancreas, spleen, adrenal glands appear unremarkable. There is a 3
mm stone at the left ureterovesical junction with mild left
hydronephrosis. Multiple small nonobstructing left renal calculi
noted. Punctate nonobstructing right renal calculi may be present.
There is no hydronephrosis on the right. The urinary bladder appears
unremarkable. The prostate and seminal vesicles are grossly
unremarkable. There is sigmoid diverticulosis without active
inflammatory changes. Moderate stool noted throughout the colon. No
evidence of bowel obstruction or inflammation. Normal appendix.

There is mild aortoiliac atherosclerotic disease. No portal venous
gas identified. There is no adenopathy. Small fat containing right
inguinal hernia. The abdominal wall soft tissues appear
unremarkable. There is mild degenerative changes of the spine. No
acute fracture.
IMPRESSION: A 3 mm left UVJ stone with mild left hydronephrosis.

Sigmoid diverticulosis. No evidence of bowel obstruction or
inflammation. Normal appendix.

## 2020-02-04 ENCOUNTER — Emergency Department (HOSPITAL_COMMUNITY): Payer: BC Managed Care – PPO

## 2020-02-04 ENCOUNTER — Other Ambulatory Visit: Payer: Self-pay

## 2020-02-04 ENCOUNTER — Encounter (HOSPITAL_COMMUNITY): Payer: Self-pay

## 2020-02-04 ENCOUNTER — Emergency Department (HOSPITAL_COMMUNITY)
Admission: EM | Admit: 2020-02-04 | Discharge: 2020-02-04 | Disposition: A | Discharge status: Home / Self Care | Payer: BC Managed Care – PPO | Attending: Emergency Medicine | Admitting: Emergency Medicine

## 2020-02-04 DIAGNOSIS — Z87891 Personal history of nicotine dependence: Secondary | ICD-10-CM | POA: Insufficient documentation

## 2020-02-04 DIAGNOSIS — N200 Calculus of kidney: Secondary | ICD-10-CM | POA: Insufficient documentation

## 2020-02-04 DIAGNOSIS — E119 Type 2 diabetes mellitus without complications: Secondary | ICD-10-CM | POA: Insufficient documentation

## 2020-02-04 DIAGNOSIS — R109 Unspecified abdominal pain: Secondary | ICD-10-CM | POA: Diagnosis present

## 2020-02-04 DIAGNOSIS — Z7984 Long term (current) use of oral hypoglycemic drugs: Secondary | ICD-10-CM | POA: Diagnosis not present

## 2020-02-04 DIAGNOSIS — Z7982 Long term (current) use of aspirin: Secondary | ICD-10-CM | POA: Insufficient documentation

## 2020-02-04 DIAGNOSIS — I10 Essential (primary) hypertension: Secondary | ICD-10-CM | POA: Insufficient documentation

## 2020-02-04 HISTORY — DX: Disorder of kidney and ureter, unspecified: N28.9

## 2020-02-04 LAB — CBC WITH DIFFERENTIAL/PLATELET
Abs Immature Granulocytes: 0.03 10*3/uL (ref 0.00–0.07)
Basophils Absolute: 0 10*3/uL (ref 0.0–0.1)
Basophils Relative: 0 %
Eosinophils Absolute: 0.1 10*3/uL (ref 0.0–0.5)
Eosinophils Relative: 1 %
HCT: 44.3 % (ref 39.0–52.0)
Hemoglobin: 14.3 g/dL (ref 13.0–17.0)
Immature Granulocytes: 0 %
Lymphocytes Relative: 9 %
Lymphs Abs: 1 10*3/uL (ref 0.7–4.0)
MCH: 28.8 pg (ref 26.0–34.0)
MCHC: 32.3 g/dL (ref 30.0–36.0)
MCV: 89.3 fL (ref 80.0–100.0)
Monocytes Absolute: 0.3 10*3/uL (ref 0.1–1.0)
Monocytes Relative: 3 %
Neutro Abs: 9.3 10*3/uL — ABNORMAL HIGH (ref 1.7–7.7)
Neutrophils Relative %: 87 %
Platelets: 237 10*3/uL (ref 150–400)
RBC: 4.96 MIL/uL (ref 4.22–5.81)
RDW: 14.3 % (ref 11.5–15.5)
WBC: 10.6 10*3/uL — ABNORMAL HIGH (ref 4.0–10.5)
nRBC: 0 % (ref 0.0–0.2)

## 2020-02-04 LAB — BASIC METABOLIC PANEL
Anion gap: 9 (ref 5–15)
BUN: 26 mg/dL — ABNORMAL HIGH (ref 6–20)
CO2: 24 mmol/L (ref 22–32)
Calcium: 8.8 mg/dL — ABNORMAL LOW (ref 8.9–10.3)
Chloride: 102 mmol/L (ref 98–111)
Creatinine, Ser: 0.85 mg/dL (ref 0.61–1.24)
GFR calc Af Amer: >60 mL/min (ref >60)
GFR calc non Af Amer: >60 mL/min (ref >60)
Glucose, Bld: 182 mg/dL — ABNORMAL HIGH (ref 70–99)
Potassium: 4.6 mmol/L (ref 3.5–5.1)
Sodium: 135 mmol/L (ref 135–145)

## 2020-02-04 LAB — URINALYSIS, ROUTINE W REFLEX MICROSCOPIC
Bacteria, UA: NONE SEEN
Bilirubin Urine: NEGATIVE
Glucose, UA: NEGATIVE mg/dL
Ketones, ur: NEGATIVE mg/dL
Leukocytes,Ua: NEGATIVE
Nitrite: NEGATIVE
Protein, ur: NEGATIVE mg/dL
RBC / HPF: >50 RBC/hpf — ABNORMAL HIGH (ref 0–5)
Specific Gravity, Urine: 1.018 (ref 1.005–1.030)
pH: 6 (ref 5.0–8.0)

## 2020-02-04 MED ORDER — HYDROCODONE-ACETAMINOPHEN 5-325 MG PO TABS: 1.0000 | ORAL_TABLET | ORAL | 0 refills | Status: AC | PRN

## 2020-02-04 MED ORDER — TAMSULOSIN HCL 0.4 MG PO CAPS
0.4000 mg | ORAL_CAPSULE | Freq: Once | ORAL | Status: AC
  Administered 2020-02-04: 0.4 mg via ORAL
  Filled 2020-02-04: qty 1

## 2020-02-04 MED ORDER — SODIUM CHLORIDE 0.9 % IV BOLUS
1000.0000 mL | Freq: Once | INTRAVENOUS | Status: AC
  Administered 2020-02-04: 12:00:00 1000 mL via INTRAVENOUS

## 2020-02-04 MED ORDER — HYDROCODONE-ACETAMINOPHEN 5-325 MG PO TABS: 1.0000 | ORAL_TABLET | ORAL | 0 refills | Status: DC | PRN

## 2020-02-04 MED ORDER — TAMSULOSIN HCL 0.4 MG PO CAPS: 0.4000 mg | ORAL_CAPSULE | Freq: Every day | ORAL | 0 refills | Status: AC

## 2020-02-04 NOTE — ED Provider Notes (Signed)
[facility] EMERGENCY DEPARTMENT Provider Note   CSN: [id number] Arrival date & time: 02/04/20  2542     History Chief Complaint  Patient presents with  . Flank Pain    Alan Clay is a 59 y.o. male with a history of diabetes, hypertension and kidney stone, reporting passage of a stone 4 years ago waking today around 5 AM with acute pain in his right flank radiating down into his lower back which has resolved upon arrival here.  He denies dysuria, hematuria, fevers or chills.  He has had 1 episode of vomiting with persistent nausea which has improved since the pain improved.  He suspects he has a new kidney stone.  He has had no treatment prior to arrival, states the pain improved spontaneously.  He denies abdominal distention, chest pain, shortness of breath.  The history is provided by the patient and the spouse.       Past Medical History:  Diagnosis Date  . Diabetes mellitus   . Hypertension   . Renal disorder    kidney stone    Patient Active Problem List   Diagnosis Date Noted  . Prediabetes 08/31/2013  . 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 KNEE SURGERY         No family history on file.  Social History   Tobacco Use  . Smoking status: Former Games developer  . Smokeless tobacco: Never Used  Substance Use Topics  . Alcohol use: No  . Drug use: No    Home Medications Prior to Admission medications   Medication Sig Start Date End Date Taking? Authorizing Provider  aspirin 81 MG tablet Take 81 mg by mouth daily.      [provider]  atorvastatin (LIPITOR) 40 MG tablet Take 40 mg by mouth daily.      [provider]  Dulaglutide (TRULICITY) 0.75 MG/0.5ML SOPN Inject 1 Dose into the skin once a week.    [provider]  HYDROcodone-acetaminophen (NORCO/VICODIN) 5-325 MG tablet Take 1 tablet by mouth every 4 (four) hours as needed for moderate pain. 02/04/20   Burgess Amor, PA-C    ibuprofen (ADVIL,MOTRIN) 200 MG tablet Take 600 mg by mouth every 6 (six) hours as needed. Pain     [provider]  ketorolac (TORADOL) 10 MG tablet Take 1 tablet (10 mg total) by mouth every 6 (six) hours. Take with food 12/28/15   Devoria Albe, MD  metFORMIN (GLUCOPHAGE) 500 MG tablet Take 500 mg by mouth daily.      [provider]  Multiple Vitamin (MULTIVITAMIN) tablet Take 1 tablet by mouth daily.      [provider]  olmesartan (BENICAR) 20 MG tablet Take 40 mg by mouth daily.     [provider]  Omega-3 Fatty Acids (FISH OIL) 1200 MG CAPS Take 1 capsule by mouth daily.      [provider]  ondansetron (ZOFRAN ODT) 4 MG disintegrating tablet Take 1 or 2 po Q 8hrs for nausea or vomiting 12/28/15   Devoria Albe, MD  oxyCODONE-acetaminophen (PERCOCET/ROXICET) 5-325 MG tablet Take 1-2 tablets by mouth every 4 (four) hours as needed for severe pain. 12/28/15   Devoria Albe, MD  tamsulosin (FLOMAX) 0.4 MG CAPS capsule Take 1 capsule (0.4 mg total) by mouth daily after supper for 7 days. 02/04/20 02/11/20  Burgess Amor, PA-C    Allergies    Patient has no known allergies.  Review of Systems  Review of Systems  Constitutional: Negative for chills and fever.  HENT: Negative for congestion and sore throat.   Eyes: Negative.   Respiratory: Negative for chest tightness and shortness of breath.   Cardiovascular: Negative for chest pain.  Gastrointestinal: Positive for nausea and vomiting. Negative for abdominal pain.  Genitourinary: Positive for flank pain. Negative for dysuria and hematuria.  Musculoskeletal: Negative for arthralgias, joint swelling and neck pain.  Skin: Negative.  Negative for rash and wound.  Neurological: Negative for dizziness, weakness, light-headedness, numbness and headaches.  Psychiatric/Behavioral: Negative.     Physical Exam Updated Vital Signs BP (!) 142/83 (BP Location: Right Arm)   Pulse 75   Temp 98.4 F (36.9 C)  (Oral)   Resp 18   Ht 5\' 8"  (1.727 m)   Wt 113.4 kg   SpO2 100%   BMI 38.01 kg/m   Physical Exam Vitals and nursing note reviewed.  Constitutional:      Appearance: He is well-developed.  HENT:     Head: Normocephalic and atraumatic.  Eyes:     Conjunctiva/sclera: Conjunctivae normal.  Cardiovascular:     Rate and Rhythm: Normal rate and regular rhythm.     Heart sounds: Normal heart sounds.  Pulmonary:     Effort: Pulmonary effort is normal.     Breath sounds: Normal breath sounds. No wheezing.  Abdominal:     General: Bowel sounds are normal. There is no distension.     Palpations: Abdomen is soft.     Tenderness: There is no abdominal tenderness. There is no guarding.     Comments: Benign abdominal exam.  No CVA tenderness.  Musculoskeletal:        General: Normal range of motion.     Cervical back: Normal range of motion.  Skin:    General: Skin is warm and dry.  Neurological:     Mental Status: He is alert.     ED Results / Procedures / Treatments   Labs (all labs ordered are listed, but only abnormal results are displayed) Labs Reviewed  CBC WITH DIFFERENTIAL/PLATELET - Abnormal; Notable for the following components:      Result Value   WBC 10.6 (*)    Neutro Abs 9.3 (*)    All other components within normal limits  BASIC METABOLIC PANEL - Abnormal; Notable for the following components:   Glucose, Bld 182 (*)    BUN 26 (*)    Calcium 8.8 (*)    All other components within normal limits  URINALYSIS, ROUTINE W REFLEX MICROSCOPIC - Abnormal; Notable for the following components:   Hgb urine dipstick LARGE (*)    RBC / HPF >50 (*)    All other components within normal limits    EKG None  Radiology CT Renal Stone Study  Result Date: 02/04/2020 CLINICAL DATA:  Right flank pain since 5 a.m. today. History of nephrolithiasis. EXAM: CT ABDOMEN AND PELVIS WITHOUT CONTRAST TECHNIQUE: Multidetector CT imaging of the abdomen and pelvis was performed following  the standard protocol without IV contrast. COMPARISON:  12/28/2015 CT abdomen/pelvis. FINDINGS: Lower chest: No significant pulmonary nodules or acute consolidative airspace disease. Coronary atherosclerosis. Hepatobiliary: Normal liver size. Simple subcentimeter posterior right liver cyst. No additional liver lesions. Normal gallbladder with no radiopaque cholelithiasis. No biliary ductal dilatation. Pancreas: Normal, with no mass or duct dilation. Spleen: Normal size. No mass. Adrenals/Urinary Tract: Normal adrenals. Tiny 2 mm right ureteropelvic junction stone with mild right hydronephrosis. Nonobstructing punctate 1 mm upper right renal stone.  No left hydronephrosis. Normal caliber ureters. Otherwise no ureteral stones. Nonobstructing 3 mm interpolar left renal stone. Normal bladder. Stomach/Bowel: Normal non-distended stomach. Normal caliber small bowel with no small bowel wall thickening. Normal appendix. Moderate diffuse colonic diverticulosis, most prominent in the sigmoid colon, with no large bowel wall thickening or significant pericolonic fat stranding. Vascular/Lymphatic: Atherosclerotic nonaneurysmal abdominal aorta. No pathologically enlarged lymph nodes in the abdomen or pelvis. Reproductive: Normal size prostate with nonspecific internal prostatic calcifications. Other: No pneumoperitoneum, ascites or focal fluid collection. Small fat containing umbilical hernia. Musculoskeletal: No aggressive appearing focal osseous lesions. Moderate thoracolumbar spondylosis. IMPRESSION: 1. Tiny 2 mm right UPJ stone with mild right hydronephrosis. 2. Additional nonobstructing stones in both kidneys. 3. Moderate diffuse colonic diverticulosis. 4. Coronary atherosclerosis. 5. Small fat containing umbilical hernia. 6. Aortic Atherosclerosis (ICD10-I70.0). Electronically Signed   By: Ilona Sorrel M.D.   On: 02/04/2020 12:59    Procedures Procedures (including critical care time)  Medications Ordered in  ED Medications  sodium chloride 0.9 % bolus 1,000 mL (0 mLs Intravenous Stopped 02/04/20 1243)  tamsulosin (FLOMAX) capsule 0.4 mg (0.4 mg Oral Given 02/04/20 1336)    ED Course  I have reviewed the triage vital signs and the nursing notes.  Pertinent labs & imaging results that were available during my care of the patient were reviewed by me and considered in my medical decision making (see chart for details).    MDM Rules/Calculators/A&P                      Labs and imaging reviewed and discussed with patient and his wife at the bedside.  He was given a dose of Flomax in the hopes this will help the stone pass quicker.  Also prescribed hydrocodone with caution given.  Encourage increase fluid intake.  Strict return precautions were discussed including uncontrolled pain or development of fever.  He was given a urine strainer.  Referral to urology. Final Clinical Impression(s) / ED Diagnoses Final diagnoses:  Kidney stone    Rx / DC Orders ED Discharge Orders         Ordered    tamsulosin (FLOMAX) 0.4 MG CAPS capsule  Daily after supper     02/04/20 1313    HYDROcodone-acetaminophen (NORCO/VICODIN) 5-325 MG tablet  Every 4 hours PRN,   Status:  Discontinued     02/04/20 1313    HYDROcodone-acetaminophen (NORCO/VICODIN) 5-325 MG tablet  Every 4 hours PRN     02/04/20 1318           Evalee Jefferson, PA-C 02/04/20 1348    Elnora Morrison, MD 02/04/20 1544

## 2020-02-04 NOTE — Discharge Instructions (Addendum)
You have a 2 mm stone in your distal right ureter which is reassuring.  However it is impossible to predict how long it will take for it to pass completely.  Strain your urine so that you will know when this does pass.  In the interim I have prescribed you Flomax to help remove the stone quicker.  You have been given a dose of this today, take your next dose tomorrow evening if you have not already passed the stone by then.  You may use the hydrocodone if needed for pain relief.  This medication will make you drowsy, do not drive within 4 hours of taking hydrocodone.  Get rechecked immediately for any worsening pain, uncontrolled vomiting or if you develop fever.  There is no evidence for infection at this time.

## 2020-02-04 NOTE — ED Triage Notes (Signed)
Pt reports had r flank pain since 0445 this morning.  Reports has vomited several times.  Has history of kidney stone.
